# Patient Record
Sex: Male | Born: 1965 | Race: White | Hispanic: No | Marital: Single | Smoking: Current every day smoker
Health system: Southern US, Community
[De-identification: ages and names within clinical notes are randomized; demographics above are authoritative.]

---

## 2007-05-11 ENCOUNTER — Ambulatory Visit: Payer: Self-pay | Admitting: Internal Medicine

## 2007-05-27 ENCOUNTER — Ambulatory Visit: Payer: Self-pay | Admitting: Internal Medicine

## 2007-06-11 ENCOUNTER — Ambulatory Visit: Payer: Self-pay | Admitting: Internal Medicine

## 2007-07-12 ENCOUNTER — Ambulatory Visit: Payer: Self-pay | Admitting: Internal Medicine

## 2007-08-09 ENCOUNTER — Ambulatory Visit: Payer: Self-pay | Admitting: Internal Medicine

## 2007-09-09 ENCOUNTER — Ambulatory Visit: Payer: Self-pay | Admitting: Internal Medicine

## 2007-10-09 ENCOUNTER — Ambulatory Visit: Payer: Self-pay | Admitting: Internal Medicine

## 2007-10-28 ENCOUNTER — Ambulatory Visit: Payer: Self-pay | Admitting: Internal Medicine

## 2007-11-09 ENCOUNTER — Ambulatory Visit: Payer: Self-pay | Admitting: Internal Medicine

## 2008-01-20 ENCOUNTER — Ambulatory Visit: Payer: Self-pay | Admitting: Internal Medicine

## 2008-02-25 IMAGING — CR DG CHEST 2V
1 series · 2 of 2 positions shown · non-contrast
Comparison: none

REASON FOR EXAM: cough
COMMENTS:

PROCEDURE:     MDR - MDR CHEST PA(OR AP) AND LATERAL  - May 27, 2007 [DATE]
RESULT:     The lung fields are clear. No pneumonia, pneumothorax or pleural
effusion is seen. The mediastinal and osseous structures are normal
appearance. Heart size is normal.

[Series 1: view not recorded · 0.17mm/px · 2 of 2 slices shown]
[im 1/2]
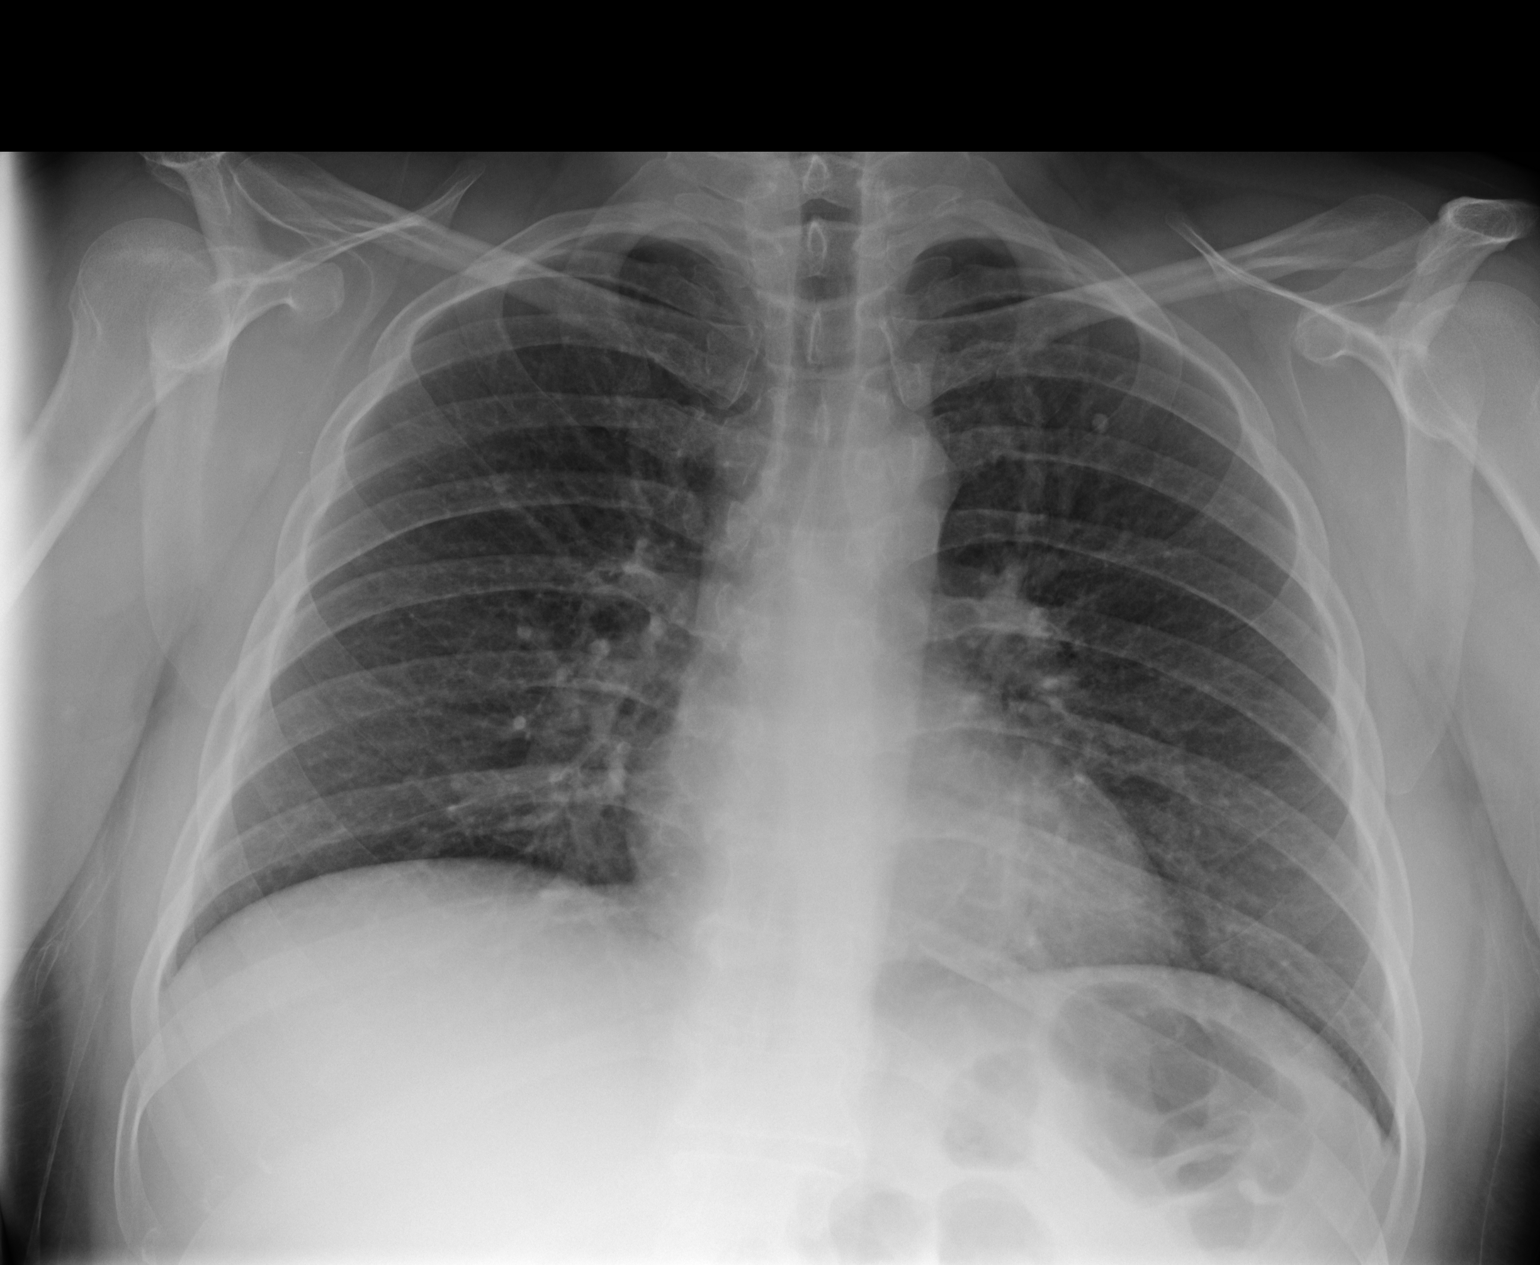
[im 2/2]
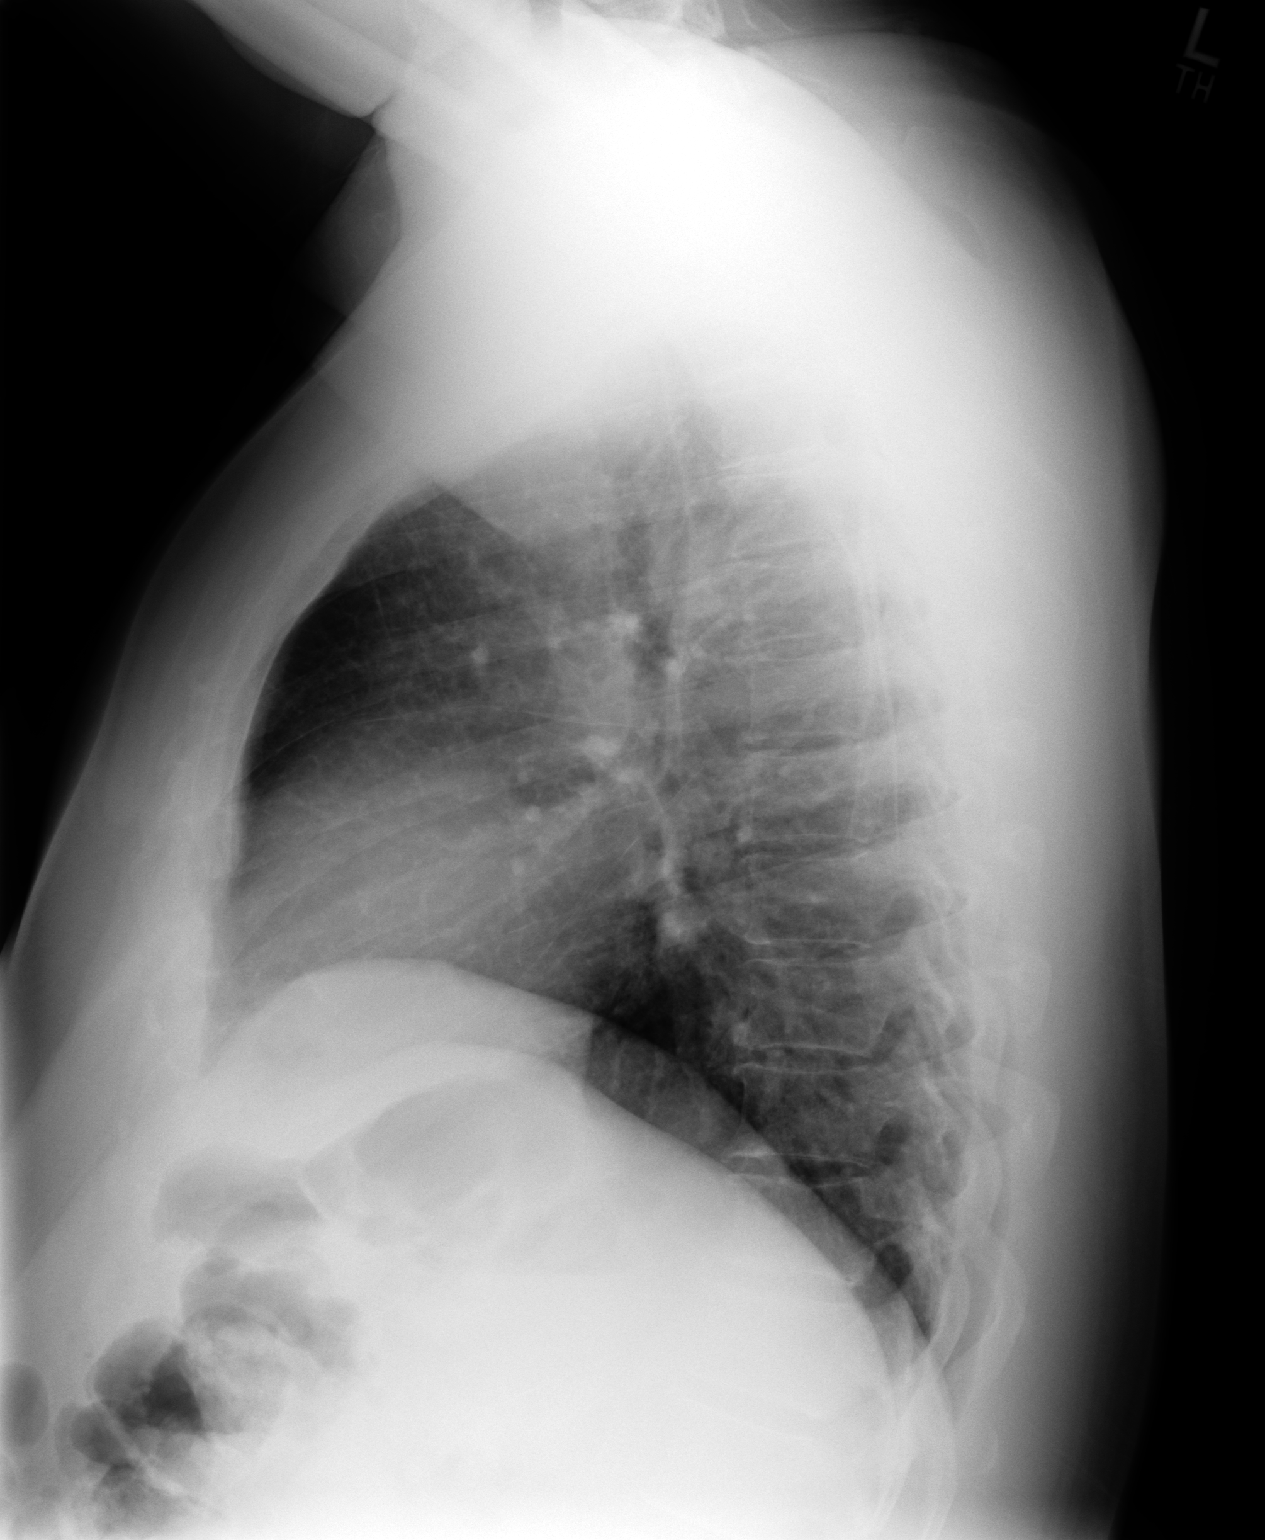

[2 of 2 positions shown; findings below may reference images not displayed]

IMPRESSION: 1. No significant abnormalities are noted.

## 2008-03-16 IMAGING — US ABDOMEN ULTRASOUND
1 series · 17 of 25 positions shown · non-contrast
Comparison: none

REASON FOR EXAM: Leukocytosis
COMMENTS:

[Series 1: abdomen ultrasound · 17 of 59 slices shown]
[im 1/59]
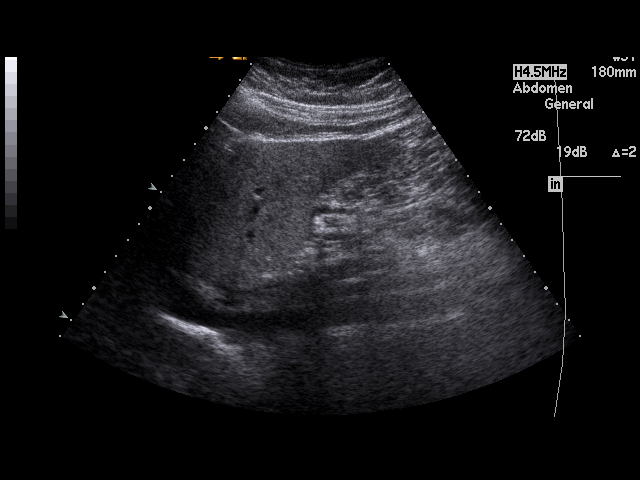
[im 5/59]
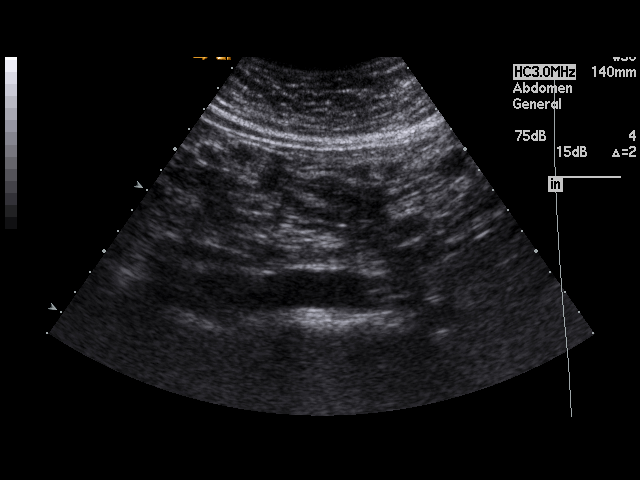
[im 8/59]
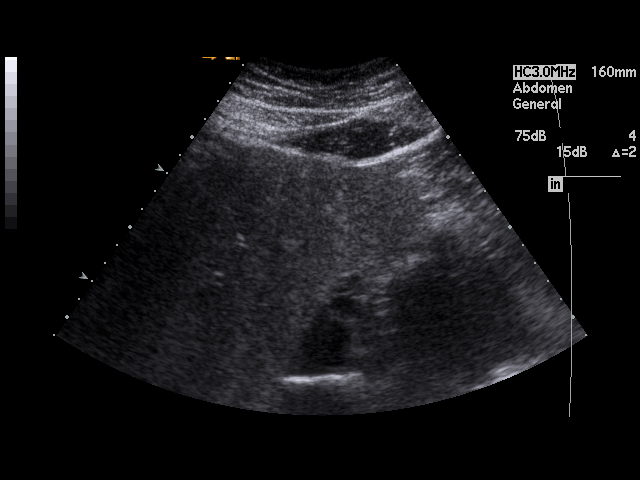
[im 13/59]
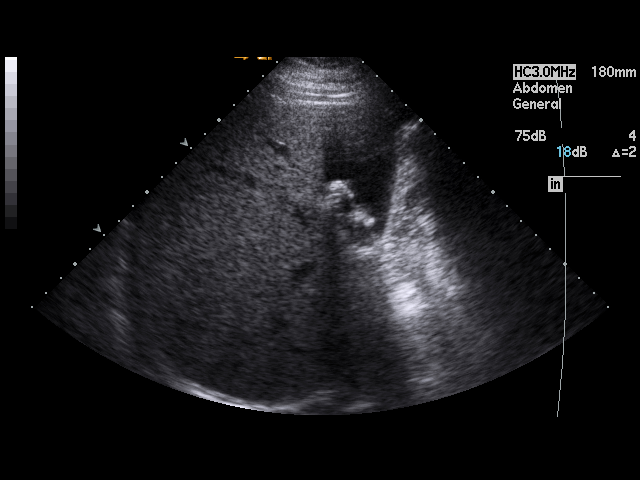
[im 15/59]
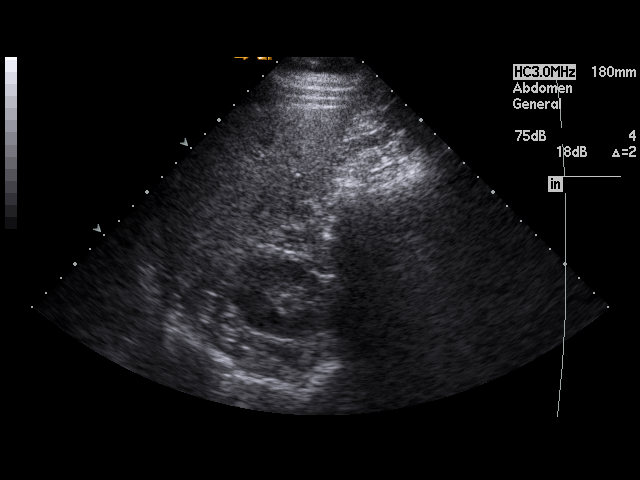
[im 20/59]
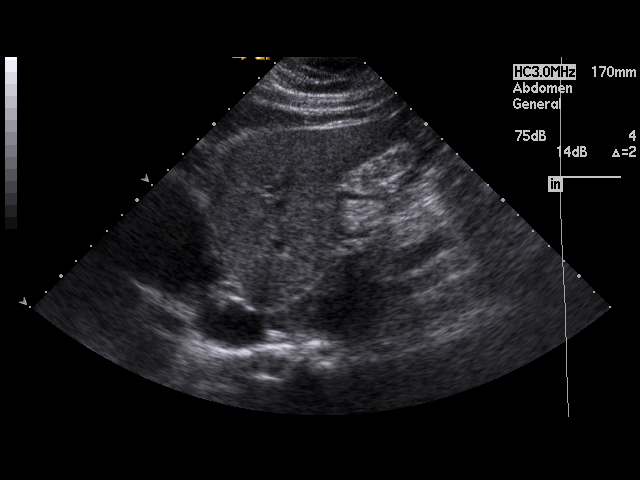
[im 22/59]
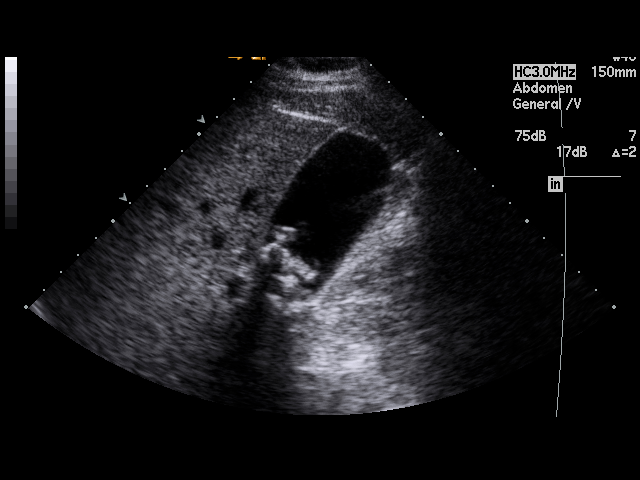
[im 27/59]
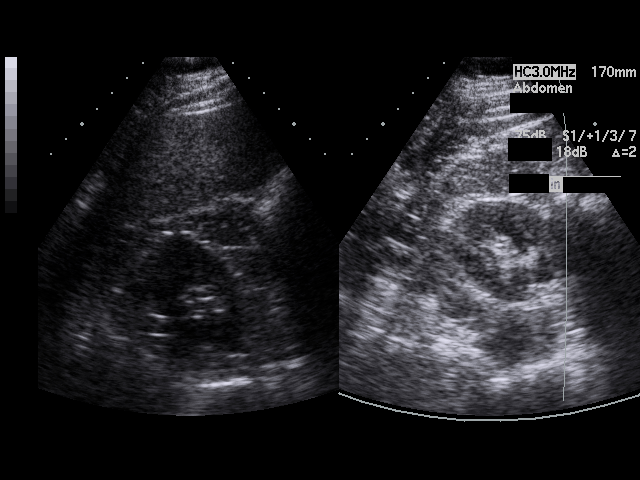
[im 30/59]
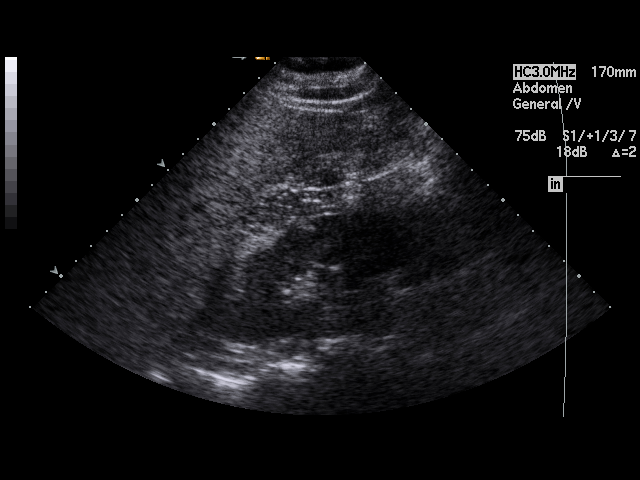
[im 32/59]
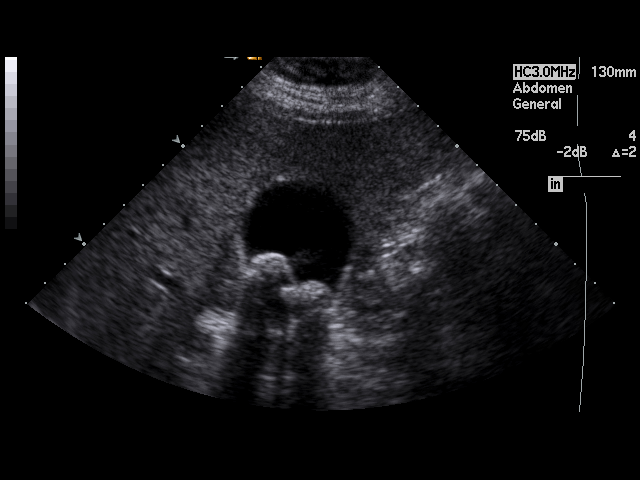
[im 37/59]
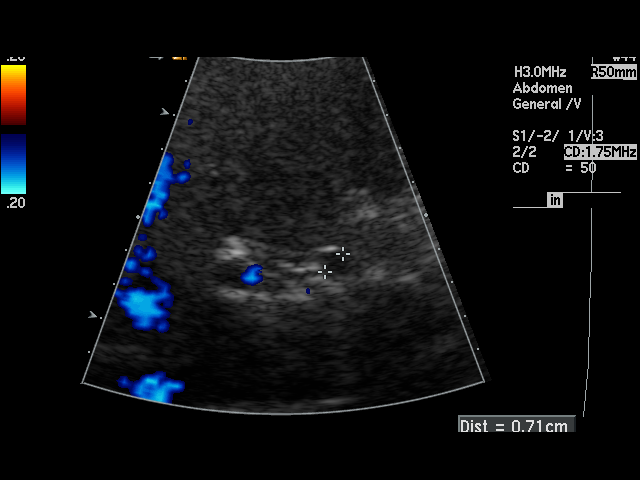
[im 39/59]
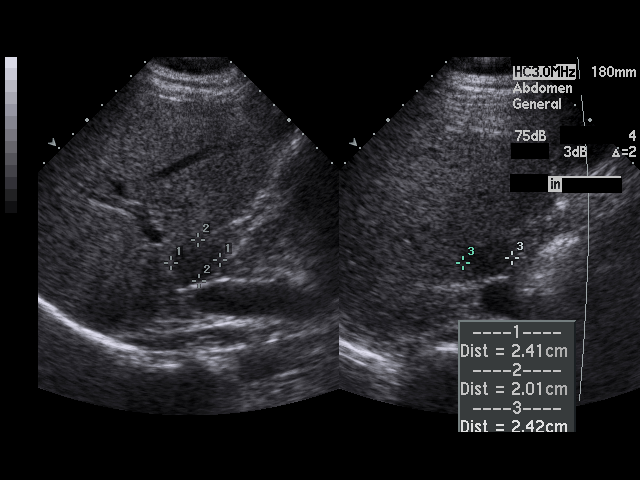
[im 44/59]
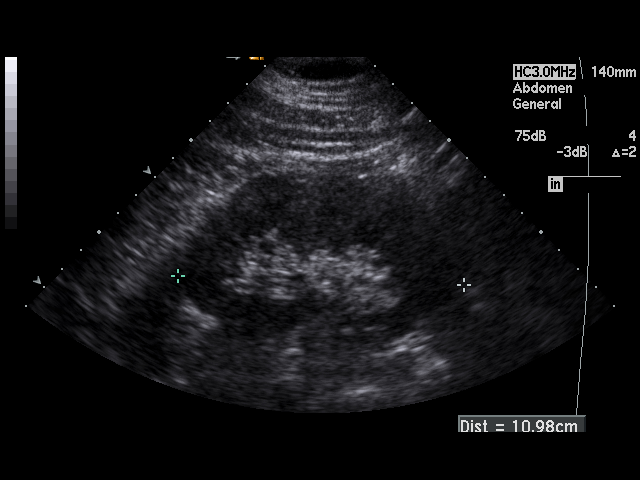
[im 46/59]
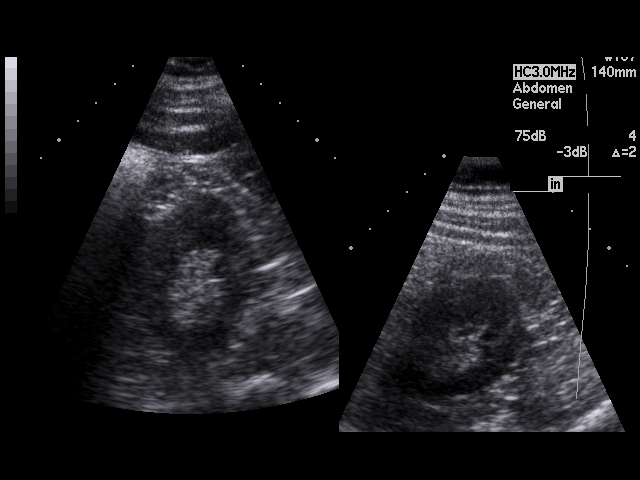
[im 51/59]
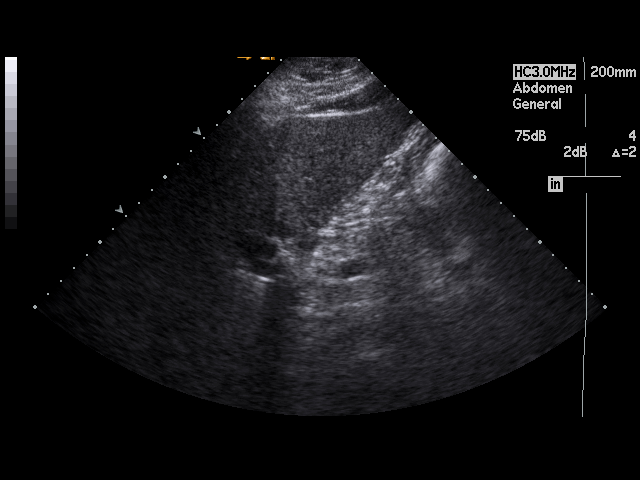
[im 54/59]
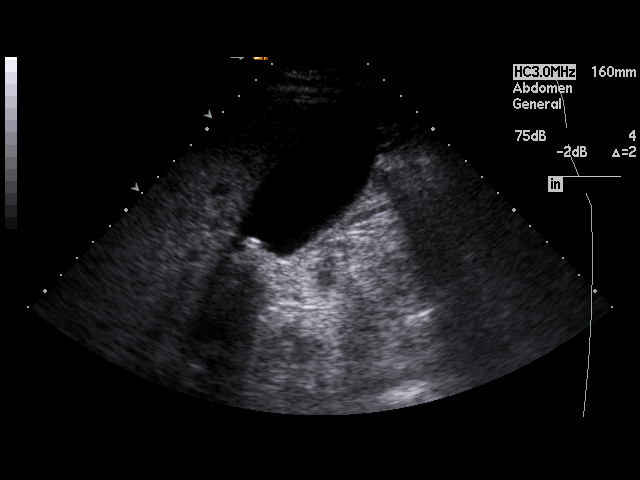
[im 59/59]
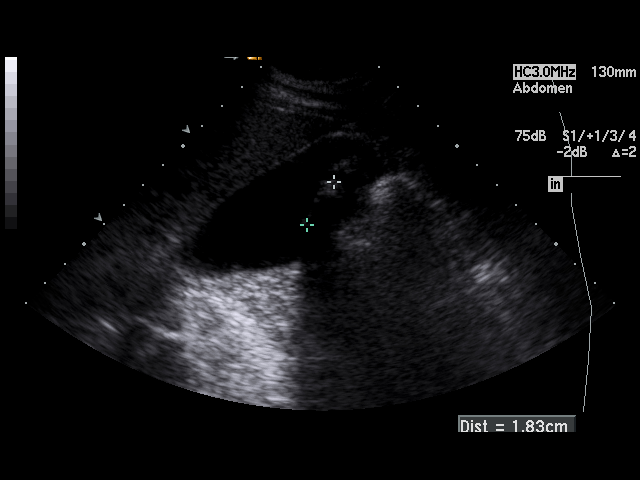

[17 of 25 positions shown; findings below may reference images not displayed]

PROCEDURE:     NALDIN - NALDIN ABDOMEN GENERAL SURVEY  - June 16, 2007  [DATE]

RESULT:     The liver is dense compatible with fatty infiltration. A
hypoechoic area is noted medially in the liver consistent with focal fatty
sparing. The abdominal aorta and inferior vena cava are normal in
appearance. Spleen size is normal. The pancreas is not visualized adequately
for evaluation on this exam. There are noted multiple mobile echodensities
in the gallbladder compatible with gallstones. There is no thickening of the
gallbladder wall. The common bile duct measures 7.1 mm in diameter which is
within normal limits. The kidneys show no hydronephrosis. There is no
ascites.
IMPRESSION: 1. The hepatic echo pattern is dense consistent with fatty infiltration.
2. Cholelithiasis.

## 2008-04-10 ENCOUNTER — Ambulatory Visit: Payer: Self-pay | Admitting: Internal Medicine

## 2008-04-12 ENCOUNTER — Ambulatory Visit: Payer: Self-pay | Admitting: Internal Medicine

## 2014-07-27 ENCOUNTER — Ambulatory Visit: Payer: Self-pay | Admitting: Family Medicine

## 2016-05-04 ENCOUNTER — Encounter: Payer: Self-pay | Admitting: Gynecology

## 2016-05-04 ENCOUNTER — Ambulatory Visit
Admission: EM | Admit: 2016-05-04 | Discharge: 2016-05-04 | Disposition: A | Discharge status: Home / Self Care | Payer: 59 | Attending: Family Medicine | Admitting: Family Medicine

## 2016-05-04 DIAGNOSIS — J209 Acute bronchitis, unspecified: Secondary | ICD-10-CM | POA: Diagnosis not present

## 2016-05-04 MED ORDER — PREDNISONE 50 MG PO TABS: ORAL_TABLET | ORAL | 0 refills | Status: AC

## 2016-05-04 MED ORDER — AZITHROMYCIN 250 MG PO TABS: ORAL_TABLET | ORAL | 0 refills | Status: AC

## 2016-05-04 NOTE — Discharge Instructions (Signed)
Take the antibiotic and prednisone as prescribed.  Follow up as needed.  Take care  Dr. Legion Discher  

## 2016-05-04 NOTE — ED Triage Notes (Signed)
Patient c/o coughing/ chills x 3 days ago.

## 2016-05-04 NOTE — ED Provider Notes (Signed)
[facility]    CSN: [id number] Arrival date & time: 05/04/16  1010  History   Chief Complaint Chief Complaint  Patient presents with  . Cough  . Chills   HPI 50 year old male with long-standing tobacco abuse presents with complaints of cough and chills.  Patient states that he's not been feeling well since Wednesday. He first developed body aches and chills. He is now experiencing a mildly productive cough. No associated shortness of breath. He's been taking Mucinex without significant improvement. No reported fever. He has had a recent sick contact (his mother has been sick with bronchitis). No known exacerbating factors. No other associated symptoms. No other complaints at this time.  ** Of note, he has been here previously in 2016.  History reviewed. No pertinent past medical history.  History reviewed. No pertinent surgical history.  Home Medications    Prior to Admission medications   Medication Sig Start Date End Date Taking? Authorizing Provider  Dextromethorphan-Guaifenesin [facility](MUCINEX DM PO) Take by mouth.   Yes Historical Provider, MD  azithromycin (ZITHROMAX) 250 MG tablet 2 tablets on Day 1, then 1 tablet daily 05/04/16   Tommie SamsJayce G Jarod Bozzo, DO  predniSONE (DELTASONE) 50 MG tablet 1 tablet daily x 5 days. 05/04/16   Tommie SamsJayce G Graden Hoshino, DO   Family History Family History  Problem Relation Age of Onset  . Diabetes Mellitus II Father   . Cancer Father    Social History Social History  Substance Use Topics  . Smoking status: Current Every Day Smoker    Packs/day: 1.00    Types: Cigarettes  . Smokeless tobacco: Never Used  . Alcohol use No   Allergies   Patient has no known allergies.  Review of Systems Review of Systems  Constitutional: Positive for chills. Negative for fever.  Respiratory: Positive for cough.   Musculoskeletal:       Body aches.   Physical Exam Triage Vital Signs ED Triage Vitals  Enc Vitals Group     BP 05/04/16 1040 (!) 156/105    Pulse Rate 05/04/16 1040 84     Resp 05/04/16 1040 16     Temp 05/04/16 1040 98.4 F (36.9 C)     Temp Source 05/04/16 1040 Oral     SpO2 05/04/16 1040 97 %     Weight 05/04/16 1043 187 lb (84.8 kg)     Height 05/04/16 1043 5\' 6"  (1.676 m)     Head Circumference --      Peak Flow --      Pain Score --      Pain Loc --      Pain Edu? --      Excl. in GC? --    Updated Vital Signs BP (!) 156/105 (BP Location: Left Arm)   Pulse 84   Temp 98.4 F (36.9 C) (Oral)   Resp 16   Ht 5\' 6"  (1.676 m)   Wt 187 lb (84.8 kg)   SpO2 97%   BMI 30.18 kg/m   Physical Exam  Constitutional: He is oriented to person, place, and time. He appears well-developed. No distress.  HENT:  Mouth/Throat: Oropharynx is clear and moist.  Neck: Neck supple.  Cardiovascular: Normal rate and regular rhythm.   Pulmonary/Chest: Effort normal and breath sounds normal.  Lymphadenopathy:    He has no cervical adenopathy.  Neurological: He is alert and oriented to person, place, and time.  Skin: No rash noted.  Psychiatric: He has a normal mood and affect.  Vitals  reviewed.  UC Treatments / Results  Labs (all labs ordered are listed, but only abnormal results are displayed) Labs Reviewed - No data to display  EKG  EKG Interpretation None      Radiology No results found.  Procedures Procedures (including critical care time)  Medications Ordered in UC Medications - No data to display  Initial Impression / Assessment and Plan / UC Course  I have reviewed the triage vital signs and the nursing notes.  Pertinent labs & imaging results that were available during my care of the patient were reviewed by me and considered in my medical decision making (see chart for details).  Clinical Course   50 year old male presents with acute bronchitis. I suspect he has some underlying lung disease given his long-standing tobacco abuse. As a result, covering with antibiotic as well as prednisone.  Final  Clinical Impressions(s) / UC Diagnoses   Final diagnoses:  Acute bronchitis, unspecified organism    New Prescriptions New Prescriptions   AZITHROMYCIN (ZITHROMAX) 250 MG TABLET    2 tablets on Day 1, then 1 tablet daily   PREDNISONE (DELTASONE) 50 MG TABLET    1 tablet daily x 5 days.     Tommie SamsJayce G Kinslee Dalpe, DO 05/04/16 1112
